# Patient Record
Sex: Female | Born: 1935
Health system: Southern US, Community
[De-identification: ages and names within clinical notes are randomized; demographics above are authoritative.]

---

## 2012-01-07 LAB — CBC
HCT: 38.4 % (ref 35.0–47.0)
HGB: 12.9 g/dL (ref 12.0–16.0)
MCH: 32.8 pg (ref 26.0–34.0)
MCHC: 33.6 g/dL (ref 32.0–36.0)
MCV: 98 fL (ref 80–100)
Platelet: 169 10*3/uL (ref 150–440)
RDW: 13.8 % (ref 11.5–14.5)

## 2012-01-07 LAB — COMPREHENSIVE METABOLIC PANEL
Albumin: 3.5 g/dL (ref 3.4–5.0)
Anion Gap: 7 (ref 7–16)
Calcium, Total: 8.3 mg/dL — ABNORMAL LOW (ref 8.5–10.1)
Chloride: 111 mmol/L — ABNORMAL HIGH (ref 98–107)
EGFR (African American): 50 — ABNORMAL LOW
EGFR (Non-African Amer.): 43 — ABNORMAL LOW
Glucose: 83 mg/dL (ref 65–99)
Potassium: 3.6 mmol/L (ref 3.5–5.1)
SGOT(AST): 28 U/L (ref 15–37)
SGPT (ALT): 19 U/L (ref 12–78)
Sodium: 148 mmol/L — ABNORMAL HIGH (ref 136–145)

## 2012-01-07 LAB — PROTIME-INR
INR: 1
Prothrombin Time: 14 secs (ref 11.5–14.7)

## 2012-01-07 LAB — TROPONIN I: Troponin-I: <0.02 ng/mL

## 2012-01-08 ENCOUNTER — Observation Stay: Payer: Self-pay | Admitting: Internal Medicine

## 2012-01-08 DIAGNOSIS — I498 Other specified cardiac arrhythmias: Secondary | ICD-10-CM

## 2012-01-08 LAB — URINALYSIS, COMPLETE
Ketone: NEGATIVE
Nitrite: NEGATIVE
Ph: 8 (ref 4.5–8.0)
Protein: NEGATIVE
Specific Gravity: 1.006 (ref 1.003–1.030)
Squamous Epithelial: <1

## 2012-01-08 LAB — TROPONIN I
Troponin-I: <0.02 ng/mL
Troponin-I: <0.02 ng/mL

## 2012-01-08 LAB — DRUG SCREEN, URINE
Barbiturates, Ur Screen: NEGATIVE (ref ?–200)
Benzodiazepine, Ur Scrn: NEGATIVE (ref ?–200)
Cannabinoid 50 Ng, Ur ~~LOC~~: NEGATIVE (ref ?–50)
Cocaine Metabolite,Ur ~~LOC~~: NEGATIVE (ref ?–300)
Methadone, Ur Screen: NEGATIVE (ref ?–300)

## 2012-01-08 LAB — CK TOTAL AND CKMB (NOT AT ARMC)
CK, Total: 60 U/L (ref 21–215)
CK, Total: 60 U/L (ref 21–215)
CK, Total: 68 U/L (ref 21–215)
CK-MB: 1 ng/mL (ref 0.5–3.6)
CK-MB: 1 ng/mL (ref 0.5–3.6)
CK-MB: 1.2 ng/mL (ref 0.5–3.6)

## 2012-01-08 LAB — T4, FREE: Free Thyroxine: 1.22 ng/dL (ref 0.76–1.46)

## 2012-01-21 ENCOUNTER — Emergency Department: Payer: Self-pay | Admitting: Emergency Medicine

## 2012-02-13 ENCOUNTER — Emergency Department: Payer: Self-pay | Admitting: Emergency Medicine

## 2012-02-13 LAB — COMPREHENSIVE METABOLIC PANEL
Alkaline Phosphatase: 83 U/L (ref 50–136)
Anion Gap: 3 — ABNORMAL LOW (ref 7–16)
Calcium, Total: 8.5 mg/dL (ref 8.5–10.1)
Chloride: 110 mmol/L — ABNORMAL HIGH (ref 98–107)
Co2: 29 mmol/L (ref 21–32)
Creatinine: 0.97 mg/dL (ref 0.60–1.30)
EGFR (African American): >60
Potassium: 4 mmol/L (ref 3.5–5.1)
SGPT (ALT): 21 U/L (ref 12–78)
Total Protein: 7 g/dL (ref 6.4–8.2)

## 2012-02-13 LAB — URINALYSIS, COMPLETE
Glucose,UR: NEGATIVE mg/dL (ref 0–75)
Ketone: NEGATIVE
Nitrite: NEGATIVE
Ph: 9 (ref 4.5–8.0)
WBC UR: 341 /HPF (ref 0–5)

## 2012-02-13 LAB — CBC
HGB: 12.8 g/dL (ref 12.0–16.0)
MCH: 32.8 pg (ref 26.0–34.0)
MCHC: 34.1 g/dL (ref 32.0–36.0)
RDW: 13.5 % (ref 11.5–14.5)

## 2012-04-09 ENCOUNTER — Emergency Department: Payer: Self-pay | Admitting: Unknown Physician Specialty

## 2012-08-19 DEATH — deceased

## 2014-03-23 IMAGING — CR DG CHEST 1V PORT
1 series · 1 of 1 positions shown · non-contrast
Comparison: none

REASON FOR EXAM: fell dementia seems to c/o pain in rib on l where had
mastectomy
COMMENTS:

[ap]
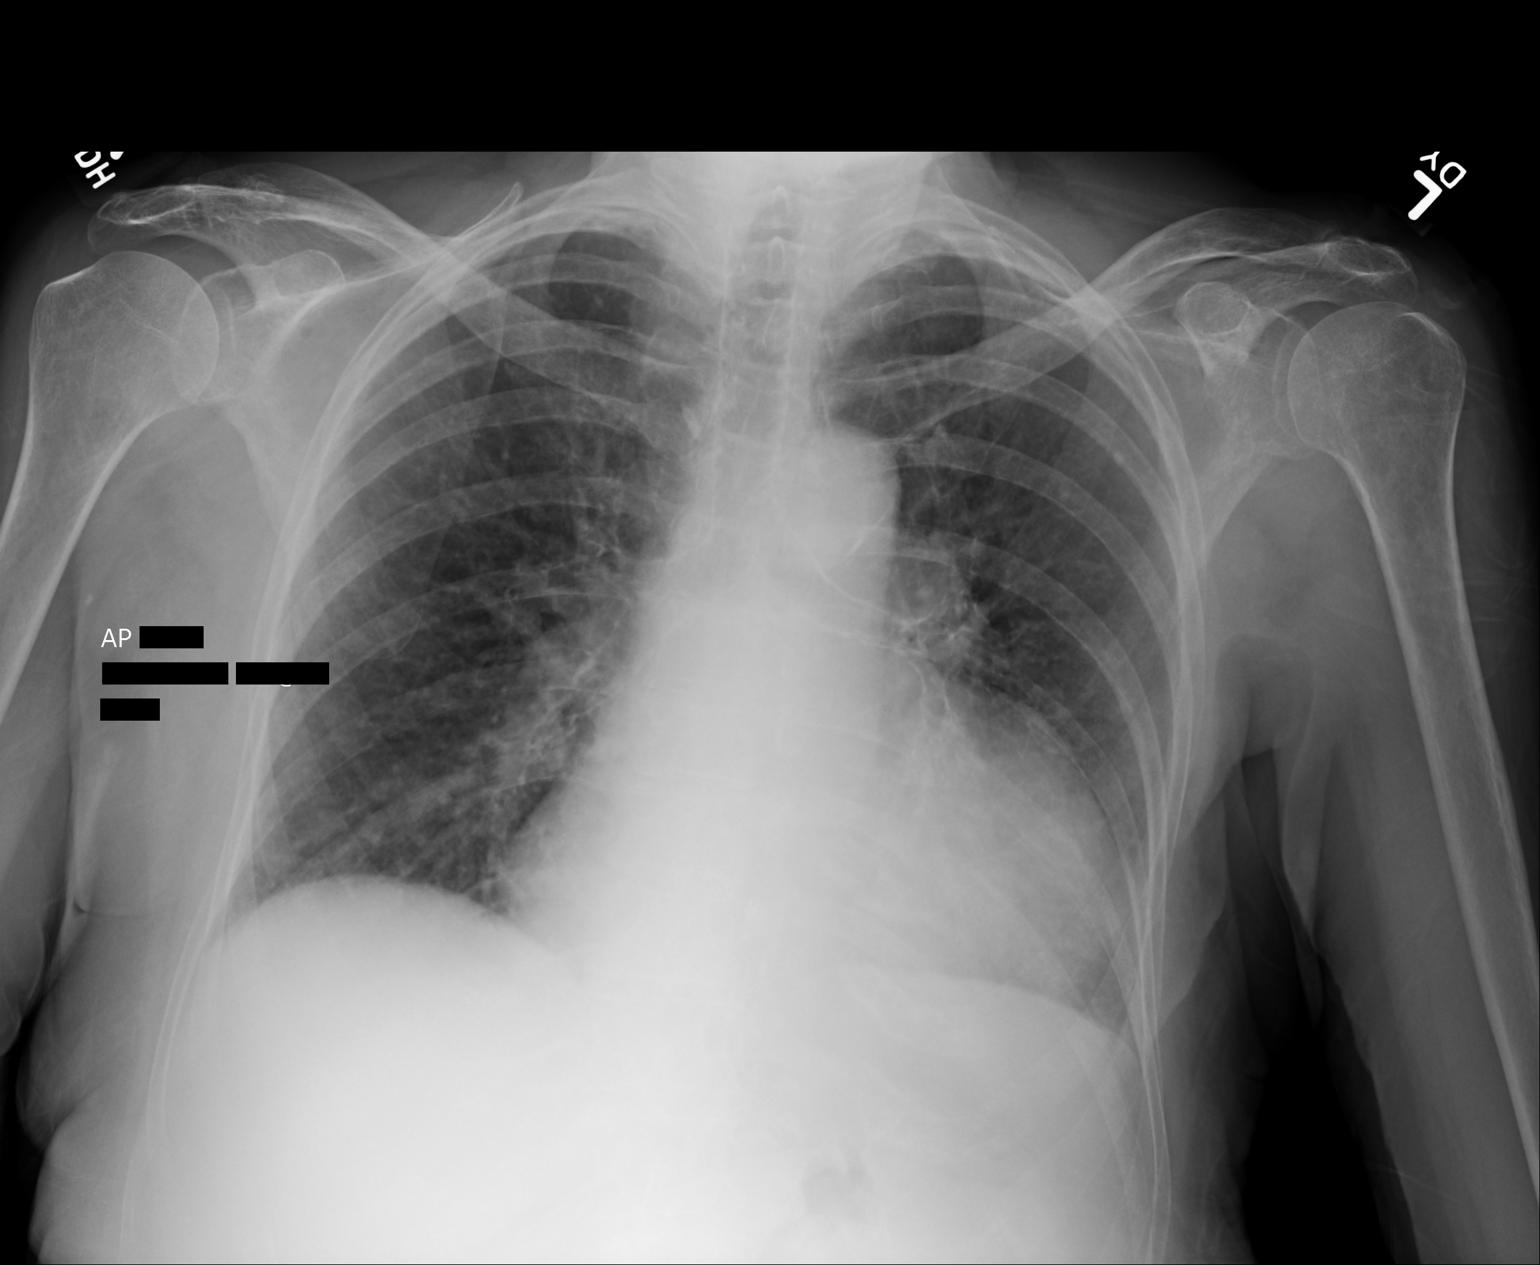

[1 of 1 positions shown; findings below may reference images not displayed]

PROCEDURE:     DXR - DXR PORTABLE CHEST SINGLE VIEW  - January 21, 2012  [DATE]

RESULT:

Comparison is made to the study 07 January, 2012.

The cardiac silhouette remains abnormal with evidence of enlargement and an
outward protrusion along the left heart border. Correlate clinically and
with echocardiography as necessary. There are no older studies for
comparison to document stability. The study is unchanged since [DATE].
The lungs show mildly prominent lung markings with some minimal right lung
base atelectasis. There is no effusion, focal consolidation or pneumothorax.
Atherosclerotic calcification is present within the aortic arch. The bony
structures appear intact.
IMPRESSION: 1.  Stable cardiomegaly.
2.  Prominent curvature of the left heart border which is unchanged but
abnormal. Whether this represents chamber enlargement or other cardiac wall
abnormality including aneurysm is uncertain. Correlate with underlying
history.
3.  Minimal right lung base atelectasis.

[REDACTED]

## 2014-06-10 IMAGING — CT CT CERVICAL SPINE WITHOUT CONTRAST
1 series · 12 of 14 positions shown, 15 images · non-contrast
Comparison: none

REASON FOR EXAM: FALL HEAD INJURY
COMMENTS:

PROCEDURE:     CT  - CT CERVICAL SPINE WO  - April 09, 2012  [DATE]
RESULT:     Comparison: None.
TECHNIQUE: Multiple axial CT images were obtained of the cervical spine,
without intravenous contrast.  Sagittal and coronal reformatted images were
constructed.

[Series 6: axial · axial · 0.33mm/px · z∈[-336,-214]mm · 12 of 86 slices shown, 15 images]
[im 7/86  soft-tissue]
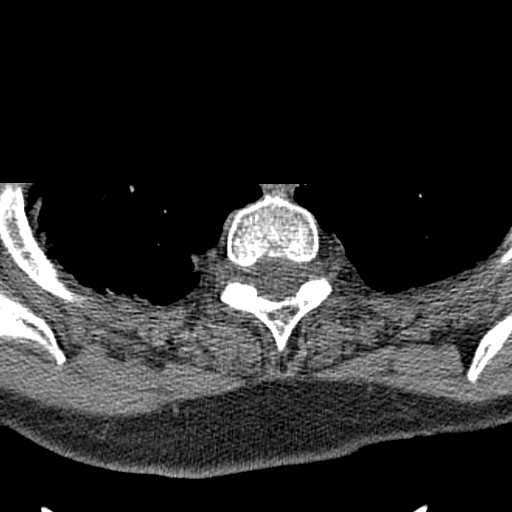
[im 7/86  bone]
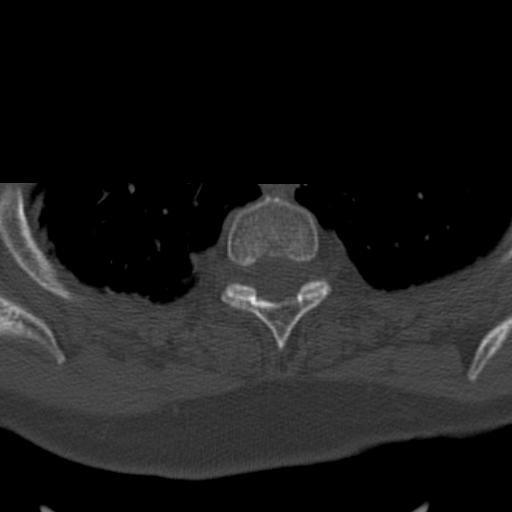
[im 14/86  bone]
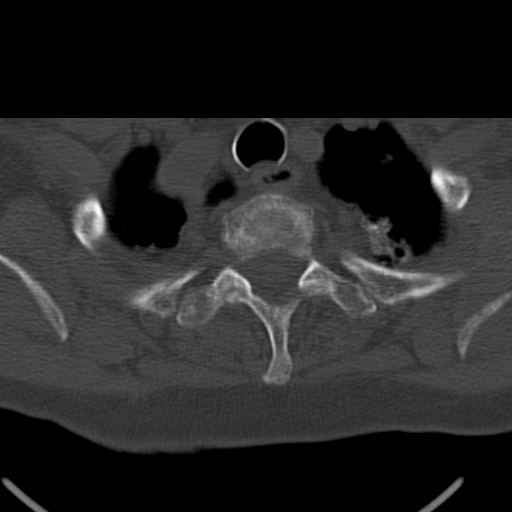
[im 20/86  bone]
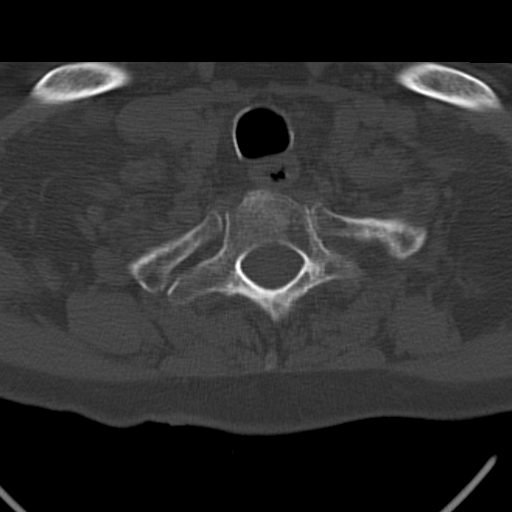
[im 27/86  bone]
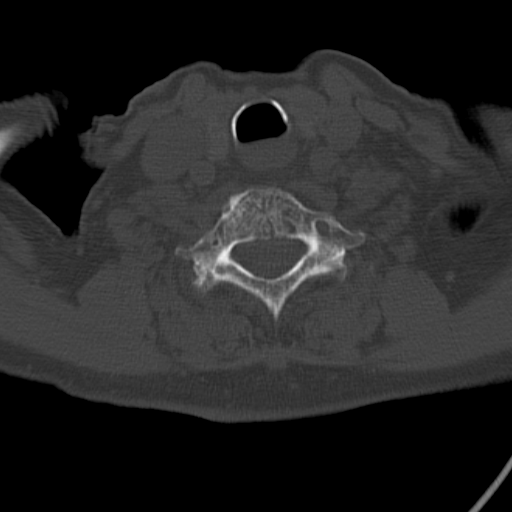
[im 33/86  soft-tissue]
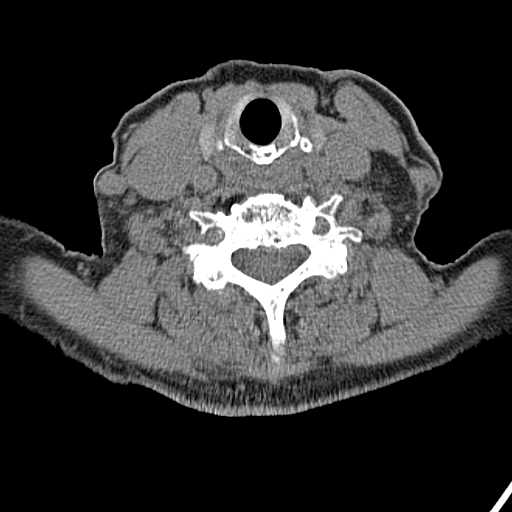
[im 33/86  bone]
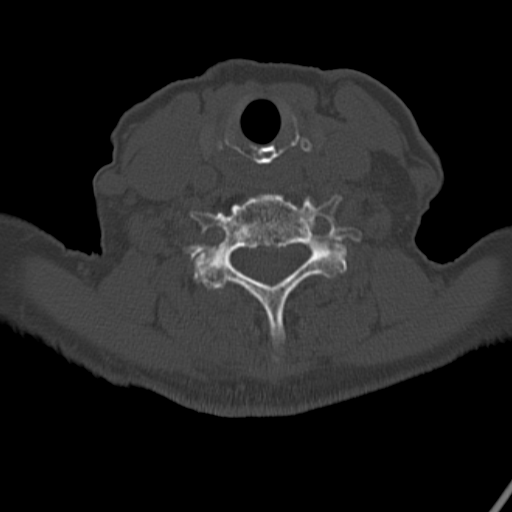
[im 40/86  bone]
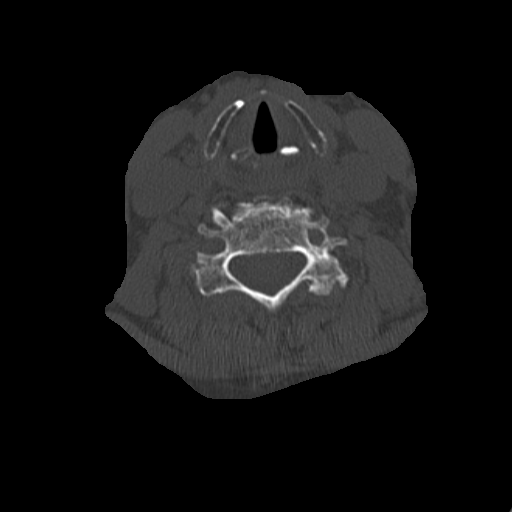
[im 46/86  bone]
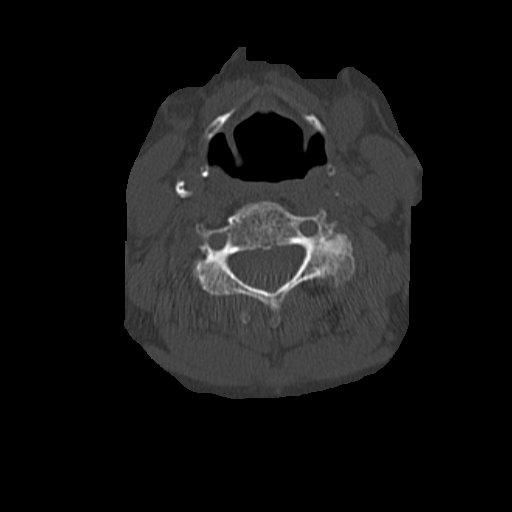
[im 53/86  bone]
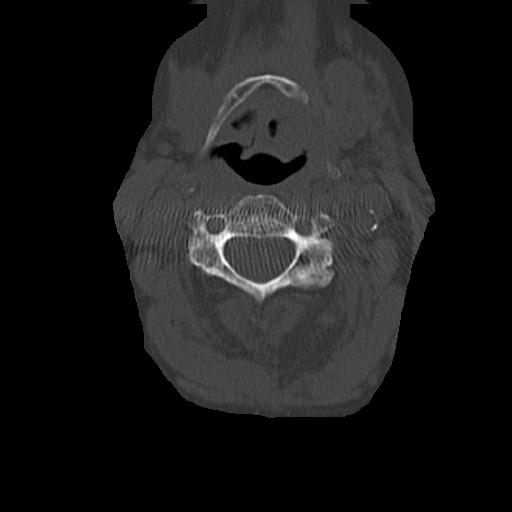
[im 59/86  soft-tissue]
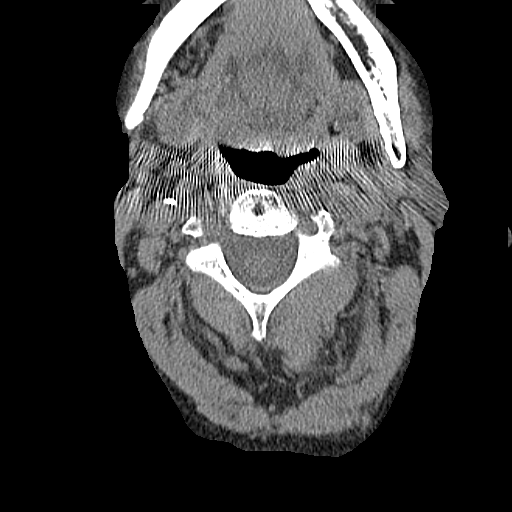
[im 59/86  bone]
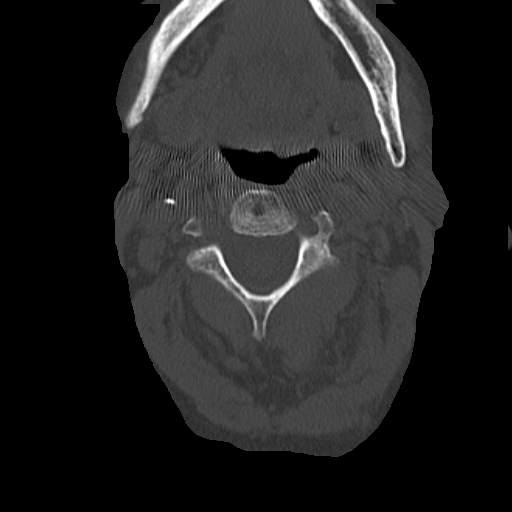
[im 66/86  bone]
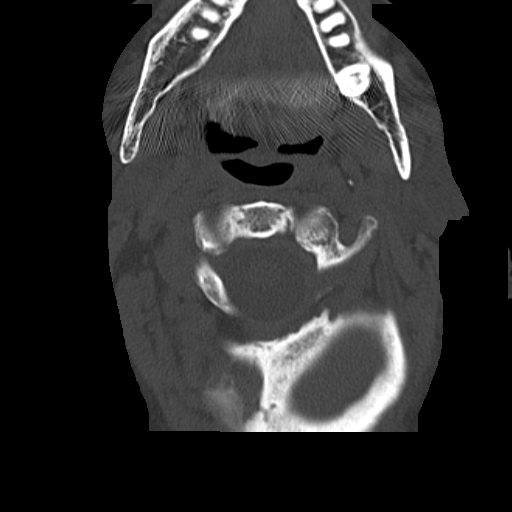
[im 72/86  bone]
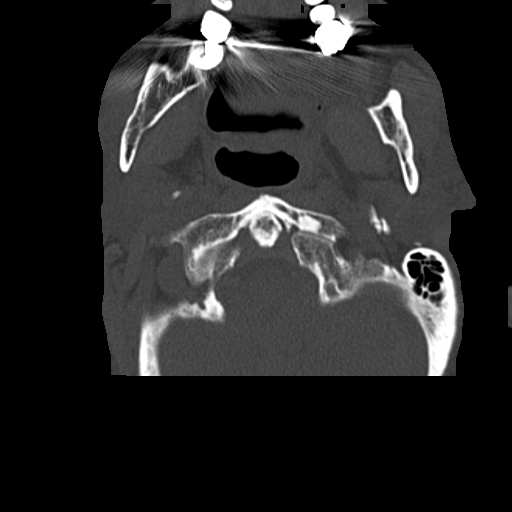
[im 79/86  bone]
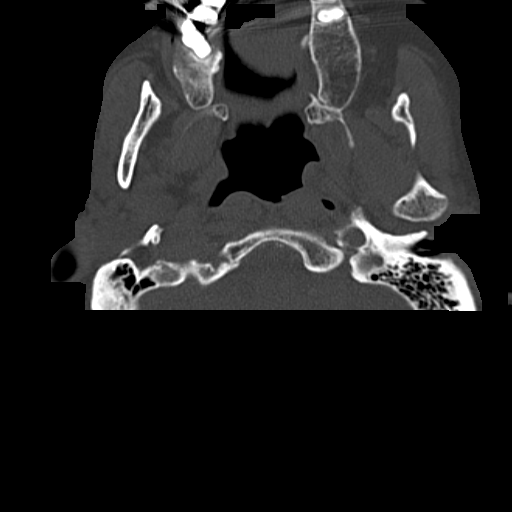

[12 of 14 positions shown; findings below may reference images not displayed]

FINDINGS: There is approximately 2 mm of anterolisthesis of C3 on C4. There is
reversal of the normal cervical lordosis, which is nonspecific. Degenerative
disc disease is seen at C4-C5, C5-C6, and C6-C7. Small sclerotic density in
the lateral mass of C1 likely represents a bone island. There is congenital
nonfusion of the posterior arch of C1. No acute fracture seen.

There is mild scarring at the lung apices.
IMPRESSION: No cervical spine fracture seen. Mild anterolisthesis of C3 on C[DATE] be
degenerative. Ligamentous injury is not excluded.

## 2014-07-08 NOTE — Consult Note (Signed)
General Aspect Bradycardia    Present Illness The patient is pleasant but very demented. She lives at a nursing home.  She has had longstanding atrial fibrillation but has not seen a cardiologist recently.  She is followed by primary care in [facility].  She was on warfarin in the past but has been off of this for five years seconary apparently to fall risk.  She has been on digoxin but has had the dose reduced in the past (about 2 years ago).    She walks at the nursing home. There has been no history of presyncope or syncope.  She has no chest pain .  No SOB, PND or orthopnea.  She was brought to the hospital after an episode of decreased responsiveness.  She was asleep on a couch and difficult to arouse.  Apparently this went on for about four hours before she was transported to the hospital.  She was bradycardic with atrial fib, rate in the 30s.  She was treated with atropine.  She has been much more responsive and back to baseline since admission. She is asymptomatic by her report at present.  FAMILY HISTORY:  Noncontributory secondary to advanced age  SOCIAL:  Widow, lives at a nursing home, never smoked.  She has two daughters.   Physical Exam:   GEN no acute distress, Confused    HEENT PERRL, moist oral mucosa    NECK supple    RESP normal resp effort  clear BS  no use of accessory muscles    CARD Irregular rate and rhythm  Normal, S1, S2  S3  No murmur    ABD denies tenderness  denies Flank Tenderness  no liver/spleen enlargement  normal BS    LYMPH negative neck    EXTR negative cyanosis/clubbing, positive edema, Trace ankle    SKIN normal to palpation    NEURO cranial nerves intact, motor/sensory function intact    PSYCH anxious, Confused   Review of Systems:   ROS Pt not able to provide ROS  Severe dementia    Medications/Allergies Reviewed Medications/Allergies reviewed     Atrial fibrillation:    Breast cancer: Mastectomy   dementia:     Hysterectomy:    C section x 2:   Home Medications: Medication Instructions Status  traZODone 25 mg orally once a day (at bedtime) x 30 days Active  aspirin 81 mg oral tablet 1  orally once a day x 30 days Active  calcium-vitamin D 500 mg-200 intl units oral tablet 1 tab(s) orally 2 times a day (with meals) Active  levothyroxine 88 mcg (0.088 mg) oral tablet 1 tab(s) orally once a day Active  Lanoxin 125 mcg (0.125 mg) oral tablet 0.5 tab(s) orally once a day Active  furosemide 40 mg oral tablet 1.5 tab(s) orally once a day Active  B12  1000 microgram(s) injectable once a month Active  polyethylene glycol 3350 oral powder for reconstitution 2 dose(s) orally 2 times a day Active  Vitamin B6 100 mg oral tablet 1 tab(s) orally once a day Active  docusate sodium 100 mg oral tablet 1 tab(s) orally 2 times a day Active  Namenda 10 mg oral tablet 1 tab(s) orally 2 times a day Active  trazodone 50 mg oral tablet 0.5 tab(s) orally 2 times a day, As Needed- for Agitation , for Anxiety, Nervousness  Active  CertaVite  1 tab(s) orally once a day Active  olanzapine 2.5 mg oral tablet 1 tab(s) orally 3 times a day  Active   Lab Results: Thyroid:  19-Oct-13 21:00    Thyroid Stimulating Hormone  5.72 (0.45-4.50 (International Unit)  ----------------------- Pregnant patients have  different reference  ranges for TSH:  - - - - - - - - - -  Pregnant, first trimetser:  0.36 - 2.50 uIU/mL)   Thyroxine, Free 1.22 (Result(s) reported on 08 Jan 2012 at 04:23AM.)  Hepatic:  19-Oct-13 21:00    Bilirubin, Total 0.4   Alkaline Phosphatase 84   SGPT (ALT) 19   SGOT (AST) 28   Total Protein, Serum 7.0   Albumin, Serum 3.5  TDMs:  19-Oct-13 21:00    Digoxin, Serum 0.49 (Therapeutic range for digoxin in patients with atrial fibrillation: 0.8 - 2.0 ng/mL. In patients with congestive heart failure a therapeutic range of 0.5 - 0.8 ng/mL is suggested as higher levels are associated with an increased risk  of toxicity without clear evidence of enhanced efficacy. Digoxin toxicity is commonly associated with serum levels > 2.0 ng/mL but may occur with lower levels, including those in the therapeutic range. Blood samples should be obtained 6-8 hours after administration to assure a reasonable volume of distribution.)  Routine Chem:  19-Oct-13 21:00    Glucose, Serum 83   BUN 16   Creatinine (comp) 1.15   Sodium, Serum  148   Potassium, Serum 3.6   Chloride, Serum  111   CO2, Serum 30   Calcium (Total), Serum  8.3   Osmolality (calc) 295   eGFR (African American)  50   eGFR (Non-African American)  43 (eGFR values <45m/min/1.73 m2 may be an indication of chronic kidney disease (CKD). Calculated eGFR is useful in patients with stable renal function. The eGFR calculation will not be reliable in acutely ill patients when serum creatinine is changing rapidly. It is not useful in  patients on dialysis. The eGFR calculation may not be applicable to patients at the low and high extremes of body sizes, pregnant women, and vegetarians.)   Anion Gap 7  20-Oct-13 21:00    Magnesium, Serum 2.1 (1.8-2.4 THERAPEUTIC RANGE: 4-7 mg/dL TOXIC: > 10 mg/dL  -----------------------)   Phosphorus, Serum 3.9 (Result(s) reported on 08 Jan 2012 at 12:50AM.)  Urine Drugs:  [id number]   Tricyclic Antidepressant, Ur Qual (comp) NEGATIVE (Result(s) reported on 08 Jan 2012 at 01:34AM.)   Amphetamines, Urine Qual. NEGATIVE   MDMA, Urine Qual. NEGATIVE   Cocaine Metabolite, Urine Qual. NEGATIVE   Opiate, Urine qual NEGATIVE   Phencyclidine, Urine Qual. NEGATIVE   Cannabinoid, Urine Qual. NEGATIVE   Barbiturates, Urine Qual. NEGATIVE   Benzodiazepine, Urine Qual. NEGATIVE (----------------- The URINE DRUG SCREEN provides only a preliminary, unconfirmed analytical test result and should not be used for non-medical  purposes.  Clinical consideration and professional judgment should be  applied to any  positive drug screen result due to possible interfering substances.  A more specific alternate chemical method must be used in order to obtain a confirmed analytical result.  Gas chromatography/mass spectrometry (GC/MS) is the preferred confirmatory method.)   Methadone, Urine Qual. NEGATIVE  Cardiac:  19-Oct-13 21:00    CK, Total 60   CPK-MB, Serum 1.0 (Result(s) reported on 08 Jan 2012 at 04:25AM.)   Troponin I < 0.02 (0.00-0.05 0.05 ng/mL or less: NEGATIVE  Repeat testing in 3-6 hrs  if clinically indicated. >0.05 ng/mL: POTENTIAL  MYOCARDIAL INJURY. Repeat  testing in 3-6 hrs if  clinically indicated. NOTE: An increase or decrease  of 30% or more on  serial  testing suggests a  clinically important change)  20-Oct-13 04:54    CK, Total 60   CPK-MB, Serum 1.0 (Result(s) reported on 08 Jan 2012 at 05:35AM.)   Troponin I < 0.02 (0.00-0.05 0.05 ng/mL or less: NEGATIVE  Repeat testing in 3-6 hrs  if clinically indicated. >0.05 ng/mL: POTENTIAL  MYOCARDIAL INJURY. Repeat  testing in 3-6 hrs if  clinically indicated. NOTE: An increase or decrease  of 30% or more on serial  testing suggests a  clinically important change)  Routine UA:  20-Oct-13 01:14    Color (UA) Straw   Clarity (UA) Hazy   Glucose (UA) Negative   Bilirubin (UA) Negative   Ketones (UA) Negative   Specific Gravity (UA) 1.006   Blood (UA) Negative   pH (UA) 8.0   Protein (UA) Negative   Nitrite (UA) Negative   Leukocyte Esterase (UA) Negative (Result(s) reported on 08 Jan 2012 at 01:36AM.)   RBC (UA) NONE SEEN   WBC (UA) NONE SEEN   Bacteria (UA) NONE SEEN   Epithelial Cells (UA) <1 /HPF   Amorphous Crystal (UA) PRESENT (Result(s) reported on 08 Jan 2012 at 01:36AM.)  Routine Coag:  19-Oct-13 21:00    Prothrombin 14.0   INR 1.0 (INR reference interval applies to patients on anticoagulant therapy. A single INR therapeutic range for coumarins is not optimal for all indications; however, the  suggested range for most indications is 2.0 - 3.0. Exceptions to the INR Reference Range may include: Prosthetic heart valves, acute myocardial infarction, prevention of myocardial infarction, and combinations of aspirin and anticoagulant. The need for a higher or lower target INR must be assessed individually. Reference: The Pharmacology and Management of the Vitamin K  antagonists: the seventh ACCP Conference on Antithrombotic and Thrombolytic Therapy. ALPFX.9024 Sept:126 (3suppl): N9146842. A HCT value >55% may artifactually increase the PT.  In one study,  the increase was an average of 25%. Reference:  "Effect on Routine and Special Coagulation Testing Values of Citrate Anticoagulant Adjustment in Patients with High HCT Values." American Journal of Clinical Pathology 2006;126:400-405.)  Routine Hem:  19-Oct-13 21:00    WBC (CBC) 5.2   RBC (CBC) 3.93   Hemoglobin (CBC) 12.9   Hematocrit (CBC) 38.4   Platelet Count (CBC) 169 (Result(s) reported on 07 Jan 2012 at 10:20PM.)   MCV 98   MCH 32.8   MCHC 33.6   RDW 13.8   EKG:   EKG Interp. by me    Interpretation Atrial fibrillation, rate 49, RAD, no acute ST T wave changes   Radiology Results: XRay:    19-Oct-13 23:26, Chest Portable Single View   Chest Portable Single View    REASON FOR EXAM:    altered mental status  COMMENTS:       PROCEDURE: DXR - DXR PORTABLE CHEST SINGLE VIEW  - Jan 07 2012 11:26PM     RESULT: The lungs are well-expanded. The cardiac silhouette is enlarged.   The pulmonary vascularity is not clearly engorged. There is no alveolar   infiltrate or pleural effusion. The mediastinum is normal in width. The   bony thorax exhibits no acute abnormality.    IMPRESSION:  The findings suggest COPD and mild cardiomegaly. I do not   see overt evidence of pulmonary edema or pneumonia.     Dictation Site: 1      Verified By: DAVID A. Martinique, M.D., MD    No Known Allergies:   Vital Signs/Nurse's  Notes: **Vital Signs.:  20-Oct-13 11:09   Vital Signs Type Routine   Temperature Temperature (F) 97.8   Celsius 36.5   Temperature Source tympanic   Pulse Pulse 69   Respirations Respirations 20   Systolic BP Systolic BP 962   Diastolic BP (mmHg) Diastolic BP (mmHg) 68   Mean BP 101   Pulse Ox % Pulse Ox % 98   Pulse Ox Activity Level  At rest   Oxygen Delivery Room Air/ 21 %     Impression Atrial fibrillation: Slow ventricular response.  I discussed this at length with the patient's daughter.  At this point, with the patient's severe dementia she would want conservative therapy.  Certainly digoxin (even a low dose) could be contributing to the slow rhythms.  This will be discontinued at discharge.  The family would prefer to avoid a pacemaker if possible.  The family will consider a cardiology follow up or out patient Holter if she has any further episodes.  She has been instructed to call [facility] if there are further events or they would like further follow up.  I think this is a very reasonable approach.   Electronic Signatures: Minus Breeding (MD)  (Signed 20-Oct-13 12:27)  Authored: General Aspect/Present Illness, History and Physical Exam, Review of System, Past Medical History, Home Medications, Labs, EKG , Radiology, Allergies, Vital Signs/Nurse's Notes, Impression/Plan   Last Updated: 20-Oct-13 12:27 by Minus Breeding (MD)

## 2014-07-08 NOTE — Discharge Summary (Signed)
PATIENT NAME:  Carol Reynolds, Carol Reynolds MR#:  [id number] DATE OF BIRTH:  [birth year]  DATE OF ADMISSION:  01/08/2012 DATE OF DISCHARGE:  01/08/2012  ADMITTING DIAGNOSES:   1. Atrial fibrillation, slow ventricular response.  2. Hypernatremia. 3. Hypothyroidism. 4. Constipation. 5. Alzheimer's dementia. 6. Depression. 7. B12 deficiency. 8. Lower extremity swelling.  9. Unresponsiveness.  10. Altered mental status likely due to atrial fibrillation/slow ventricular response.   DISCHARGE DIAGNOSES: 1. Atrial fibrillation, slow ventricular response, resolved with atropine. 2. Altered mental status likely due to atrial fibrillation, resolved.  3. Hypernatremia.  4. Mild dehydration.  5. Elevated blood pressure without diagnosis of hypertension, possibly stress related.  6. History of dementia. 7. Constipation. 8. Hypothyroidism. 9. Depression. 10. Chronic lower extremity swelling.   DISCHARGE CONDITION: Stable.   DISCHARGE MEDICATIONS: The patient is to resume her outpatient medications which are:  1. Levothyroxine 88 mcg p.o. daily.  2. Calcium with Vitamin D 500/200 units 1 tablet twice daily.  3. Aspirin 81 mg p.o. daily.  4. Trazodone 25 mg p.o. at bedtime.  5. Vitamin B12 1000 mcg injectable once monthly IM.  6. Polyethylene glycol two doses twice a day.  7. Vitamin B6 100 mg p.o. daily.  8. Docusate sodium 100 mg p.o. twice daily.  9. Namenda 10 mg p.o. twice daily.  10. Trazodone 25 mg p.o. twice daily as needed for agitation, anxiety, and nervousness. 11. Certavite multivitamin 1 tablet once daily.  12. Olanzapine 2.5 mg p.o. 3 times daily.   DO NOT TAKE: The patient is not to take Lanoxin or furosemide unless recommended by primary care physician.   HOME OXYGEN: None.   DIET: 2 gram salt, low fat, low cholesterol, mechanical soft.   ACTIVITY LIMITATIONS: As tolerated.   REFERRAL: Home health physical therapy 2 to 7 times a week.  FOLLOW-UP: Follow-up appointment with  Dr. Margorie John in two days after discharge.   CONSULTANT: Cardiologist, Dr. Rollene Rotunda    RADIOLOGIC STUDIES: Chest x-ray, portable, single view, 01/07/2012, revealed findings suggestive of COPD and mild cardiomegaly. No overt evidence of pulmonary edema or pneumonia.   REASON FOR ADMISSION: The patient is an 81 year old Caucasian female with past medical history significant for history of dementia, atrial fibrillation, who is not on chronic anticoagulation, who presented to the hospital after she was found to be unresponsive and having bradycardia in the facility. Please refer to Dr. Serita Sheller admission note on 01/08/2012. Upon EMS arrival to facility, the patient was unresponsive to verbal stimulation. She received a mg of Narcan and because her pulse was noted to be 37 atropine half amp was given and that resolved her symptoms. EKG from EMS revealed atrial fibrillation at rate of 37 beats per minutes which increased to 50 beats per minute after atropine was given.  PHYSICAL EXAMINATION: On arrival to the hospital, the patient's temperature was 98.1, pulse is ranging from 38 to 60's, respiration rate 19, blood pressure 167/84, saturation was 98% on 2 liters of oxygen through nasal cannula. Physical exam was unremarkable except for 1+ lower extremity edema around her ankles.  LABORATORY, DIAGNOSTIC, AND RADIOLOGICAL DATA: The patient's lab data done in the Emergency Room on 01/07/2012 showed elevated sodium level to 148, otherwise BMP was unremarkable. The patient's phosphorus as well as magnesium level were normal at 3.9 and 2.1 respectively. Her potassium level was normal at 2.6. The patient's liver enzymes were unremarkable. The patient's cardiac enzymes, first set as well as subsequent two more sets, were within normal limits. TSH  was elevated at 5.72, however, free thyroxine was normal at 1.22. Digoxin level was 0.49. Urine drug screen was negative. CBC was within normal limits with white blood  cell count 5.2, hemoglobin 12.9, platelet count 169. Coagulation panel was normal with pro-time of 14.0, INR 1.0. Urinalysis showed a straw hazy urine, negative for glucose, bilirubin, or ketones, specific gravity 1.006, pH 8.0, negative for blood, protein, nitrites, or leukocyte esterase, no red blood cells, white blood cells, or bacteria was seen, less than 1 epithelial cell was noted and amorphous crystal was present. Chest x-ray was unremarkable.   HOSPITAL COURSE: The patient was admitted to the hospital for observation. She was followed on telemetry and remained in sinus rhythm around 60's. She was seen by Dr. Rollene RotundaJames Hochrein, cardiologist, in the middle of the day who felt that patient has atrial fibrillation with slow ventricular response. He discussed this issue at length with the patient's daughter. Because of the patient's severe dementia, the daughter reiterated that she would like to have just conservative therapy. Dr. Antoine PocheHochrein felt that digoxin, even low dose, could be contributing to slow rhythm. That is why he recommended to discontinue it at discharge. The patient's family stated that they would prefer to avoid a pacemaker if possible. The family will consider a Cardiology follow-up or outpatient Holter monitor if she has any other further episodes. The patient's daughter was instructed to call [facility] if there are further events or they would like further follow-up. Dr. Antoine PocheHochrein felt it was a very reasonable approach and recommended to discharge the patient home. The patient was ambulated in the facility by nursing staff and felt that the patient would benefit from home health assessment in the facility and physical therapist the facility.   DISPOSITION: The patient is being discharged in stable condition with the above-mentioned medications and follow-up.  VITAL SIGNS ON THE DAY OF DISCHARGE: Temperature 97.8, pulse 69, respiration rate 20, blood pressure 168/68, saturation 98% on  room air at rest.   Of note, the patient's blood pressure remained somewhat elevated while she was in the hospital. It is recommended to follow the patient's blood pressure readings as outpatient and initiate blood pressure lowering medications if needed. The patient is to follow-up with her primary care physician in the next few days after discharge.   TIME SPENT: 40 minutes.   ____________________________ Katharina Caperima Sheli Dorin, MD rv:drc D: 01/08/2012 18:34:06 ET T: 01/09/2012 10:58:12 ET JOB#: [id number]  cc: Katharina Caperima Elihue Ebert, MD, <Dictator> Dr. Rolm BaptiseWhitt, [facility]  Lakota Schweppe MD ELECTRONICALLY SIGNED 01/21/2012 8:04

## 2014-07-08 NOTE — H&P (Signed)
PATIENT NAME:  Carol Reynolds, Carol Reynolds MR#:  [id number] DATE OF BIRTH:  Feb 19, 1924  DATE OF ADMISSION:  01/08/2012  PRIMARY CARE PHYSICIAN: Dr. Sharlot Gowda at [facility]   CHIEF COMPLAINT: Unresponsiveness, bradycardia.   HISTORY OF PRESENT ILLNESS: The history is obtained from daughter as well as Emergency Department  records. The patient is an 81 year old female with history of atrial fibrillation on digoxin, hypothyroidism and severe dementia, presents from an assisted living facility after being found unresponsive. Apparently the patient was last noted to be normal probably around 6:30 p.m. She had apparently been sleeping most of the day. Her daughter apparently called, and when the patient was alerted about her daughter's call the patient was unarousable; so assisted living staff called EMS. Upon EMS arrival, the patient was unresponsive to verbal stimulation. She got a milligram of Narcan. Her pulse was 37, so atropine half amp was given and that resolved her symptoms. Review of records shows the patient is not on any narcotics. EKG from EMS pre-atropine showed slow atrial fibrillation at 37 beats per minute, and after the atropine the rate increased to the 50s.   Of note, the patient does have Alzheimer's dementia and does have issues with agitation. She was recently hospitalized at [facility] into the Psychiatric Unit for management of severe agitation and had some medication adjustments.   For her atrial fibrillation, the diagnosis was made a long time ago, and per her daughter the patient has only been on digoxin for control. She had been on anticoagulation; however, this was discontinued a few years ago due to the patient's increased risk for fall. The patient is apparently usually alert but confused due to her dementia and usually walks without assistance. Her mental status does tend to fluctuate due to her dementia, but otherwise she would be alert and communicative. Apparently one year ago her primary  care physician decreased her digoxin dose by half due to high levels. She has not seen her cardiologist in years.   PAST MEDICAL HISTORY:  1. Atrial fibrillation.  2. Depression.  3. Alzheimer's dementia.  4. Hypothyroidism.  5. No prior history of myocardial infarction.  6. Constipation.  7. B12 deficiency.  PAST SURGICAL HISTORY:  1. Hysterectomy.  2. Mastectomy.  3. Two C-sections.   ALLERGIES: No known drug allergies.   MEDICATIONS:  1. Trazodone 25 mg (50 mg 1/2 tablet) daily at 3:00 p.m. for afternoon agitation.  2. Levothyroxine 88 mcg daily.  3. Aspirin 162 mg daily.  4. Digoxin 125 mcg, 1/2 tablet for a total dose of 62 mcg daily.  5. Furosemide 60 mg by mouth daily (40 mg, 1-1/2 tablets).  6. B12 injection 1000 mcg monthly.  7. Calcium 600 + vitamin D 400, 1 tablet by mouth daily. 8. Polycillin glycol 17 grams b.i.d.  9. Vitamin B6 100 mg daily.  10. Docusate sodium 100 mg b.i.d. 11. Namenda 10 mg, 1 tab by mouth b.i.d.  12. Certavite tablet 1 tab by mouth daily. 13. Knee-high TED stockings in the morning and in the evening.  14. Olanzapine 2.5 mg, 1 tab by mouth t.i.d.  15. Acetaminophen 325 mg tablet, frequency unknown.   FAMILY HISTORY: Mother had a pacemaker placed when she was older. She also had rheumatoid arthritis. This is the only family history the daughter is able to provide.   SOCIAL HISTORY: The daughter denies the patient has a tobacco or alcohol history. She currently lives in a nursing home.    REVIEW OF SYSTEMS: This is  obtained from the patient. The patient is awake, however, given her dementia it is unclear how much of the review of systems one can believe. CONSTITUTIONAL: The patient denies fever, fatigue. EYES: Denies double vision, blurred vision. She does wear glasses. ENT: The daughter reports the patient intermittently complains of dizziness; however, the patient denies dizziness or lightheadedness to me. Denies tinnitus or ear pain.  RESPIRATORY: The patient denies cough or wheeze. CARDIOVASCULAR: The patient denies chest pain. She does have lower extremity edema in her ankles. GASTROINTESTINAL: Denies nausea or vomiting. Admits to constipation. ENDOCRINE: Denies increased sweating. INTEGUMENTARY:  Denies any new skin rashes. MUSCULOSKELETAL: Denies any arthralgias or myalgias. NEUROLOGICAL: Denies any numbness, weakness, headaches. Has history of dementia. PSYCHIATRIC: The patient has history of depression.    PHYSICAL EXAMINATION:  VITAL SIGNS: Temperature 98.1, pulse ranging 38 to 62, respirations 19, blood pressure 167/84, sating 98% on 2 liters nasal cannula.   GENERAL: Elderly Caucasian female in no apparent distress resting comfortably in stretcher.   HEENT: Eyes: Extraocular muscles intact; however, the patient did not follow commands appropriately. She had difficulty following commands. Anicteric sclerae. Pupils are equal, round and reactive to light and accommodation. ENT: Normal external ears and nares. Posterior oropharynx is clear. Dentition intact.   CARDIOVASCULAR: Bradycardic on exam. There is 1+ edema at the ankles. Pulses are equal bilaterally.   LUNGS: Clear to auscultation bilaterally. No wheezes, rales, or rhonchi. Normal effort.   ABDOMEN: Soft, nontender, nondistended with no hepatomegaly.   PSYCHIATRIC: The patient is awake and alert, however, she is not oriented. She is calm and appropriate currently. She tries to respond but is unable to respond appropriately to questions due to her dementia. Judgment as such is not intact.   SKIN: Warm and dry. There is an ecchymotic area on the left arm. Otherwise, no rashes are noted. There is a subcutaneous nodule over the left hand measuring about 2 cm in diameter, soft, well-circumscribed, mobile.    MUSCULOSKELETAL: Full range of motion in the extremities. No clubbing, no cyanosis noted.     LABORATORY, DIAGNOSTIC AND RADIOLOGICAL DATA: CBC shows WBC count  5.2, hemoglobin 12.9, hematocrit 38.4, platelets 169, MCV of 98. BMP shows glucose 83, BUN 16, creatinine 1.005, sodium 148, potassium 3.6, chloride 116, bicarbonate 30, calcium 8.3, bilirubin 0.4, alkaline phosphatase 84, ALT 19, AST 28, total protein 7, albumin 3.5 with a GFR of 43, anion gap 7.0. INR is 1, troponin is less than 0.02. Digoxin level is 0.49. Therapeutic range in patients with atrial fibrillation is 0.8 to 2.0. Urinalysis is negative for nitrites and leukocyte esterase. No RBCs, white blood cells, bacteria was visualized. Urine drug screen was negative. Magnesium level 2.1. Phosphorous was 3.9. EKG in the Emergency Department shows atrial fibrillation with slow ventricular response at 56 beats per minute. During my exam in the room, the patient's heart rate ranged from 38 when her eyes were closed to 50.  Chest x-ray normal. Full report pending.   ASSESSMENT AND PLAN: An 81 year old female presenting with decreased responsiveness, found to be bradycardic with underlying rhythm of atrial fibrillation with slow ventricular response at 37 beats per minute, responsive to atropine.   1. Slow atrial fibrillation: The patient is on digoxin, wonder if this could be contributing to her bradycardia. We will check thyroid function test as well as cardiac enzymes to rule out comorbid conditions. We will consult Cardiology for further recommendations. Given the patient's overall comorbidities, I doubt this patient will be a candidate for  pacemaker placement; however, we will place on telemetry to rule out any other malignant arrhythmias.  2. Mild hypernatremia: We will encourage free water intake. 3. Hypothyroidism: We will continue her Synthroid, will check TSH and free T4.  4. Constipation: We will continue her laxative.  5. Alzheimer's dementia: We will continue her Namenda.   6. Depression: We will continue her trazodone.  7. B12 deficiency: We will continue replacement in the outpatient setting as  she gets monthly intramuscular injections.  8. Lower extremity swelling, currently limited to her ankles: We will continue her Lasix. The patient does not have any renal dysfunction currently.   9. Prophylaxis: Lovenox.   CODE STATUS: The patient is a DO NOT RESUSCITATE/DO NOT INTUBATE. This was discussed with both of her daughters who are her Power of Attorneys, Ms. Victorino Dike and Alinda Sierras. They both agree that given the patient's comorbidities extraordinary measures would not provide the patient with a better quality of life.   DISPOSITION: The patient is being admitted to observation status for evaluation of bradycardia.   TIME SPENT: 60 minutes.   ____________________________ Aurther Loft, DO [id number] D: 01/08/2012 03:13:37 ET T: 01/08/2012 10:54:14 ET JOB#: [id number]  cc: Aurther Loft, DO, <Dictator> [facility], Dr. Cristopher Peru E Rhydian Baldi DO ELECTRONICALLY SIGNED 01/22/2012 6:26
